# Patient Record
Sex: Male | Born: 1990 | Race: White | Hispanic: Yes | Marital: Married | Smoking: Never smoker
Health system: Southern US, Community
[De-identification: ages and names within clinical notes are randomized; demographics above are authoritative.]

## PROBLEM LIST (undated history)

## (undated) DIAGNOSIS — G43909 Migraine, unspecified, not intractable, without status migrainosus: Secondary | ICD-10-CM

## (undated) HISTORY — DX: Migraine, unspecified, not intractable, without status migrainosus: G43.909

---
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
## 2009-01-05 ENCOUNTER — Ambulatory Visit (HOSPITAL_BASED_OUTPATIENT_CLINIC_OR_DEPARTMENT_OTHER): Admission: RE | Admit: 2009-01-05 | Discharge: 2009-01-05 | Payer: Self-pay | Admitting: Plastic Surgery

## 2009-02-08 ENCOUNTER — Ambulatory Visit (HOSPITAL_COMMUNITY): Admission: RE | Admit: 2009-02-08 | Discharge: 2009-02-08 | Payer: Self-pay | Admitting: Plastic Surgery

## 2009-07-08 ENCOUNTER — Emergency Department (HOSPITAL_COMMUNITY): Admission: EM | Admit: 2009-07-08 | Discharge: 2009-07-09 | Payer: Self-pay | Admitting: Emergency Medicine

## 2009-09-13 ENCOUNTER — Emergency Department (HOSPITAL_COMMUNITY): Admission: EM | Admit: 2009-09-13 | Discharge: 2009-09-13 | Payer: Self-pay | Admitting: Emergency Medicine

## 2009-09-14 ENCOUNTER — Ambulatory Visit: Payer: Self-pay | Admitting: Psychiatry

## 2009-09-14 ENCOUNTER — Inpatient Hospital Stay (HOSPITAL_COMMUNITY): Admission: EM | Admit: 2009-09-14 | Discharge: 2009-09-14 | Payer: Self-pay | Admitting: Psychiatry

## 2009-09-17 IMAGING — CR DG CHEST 2V
2 series · 2 of 2 positions shown · non-contrast
Comparison: 01/03/2007

CLINICAL DATA: Cough short of breath

CHEST - 2 VIEW

[w chest pa]
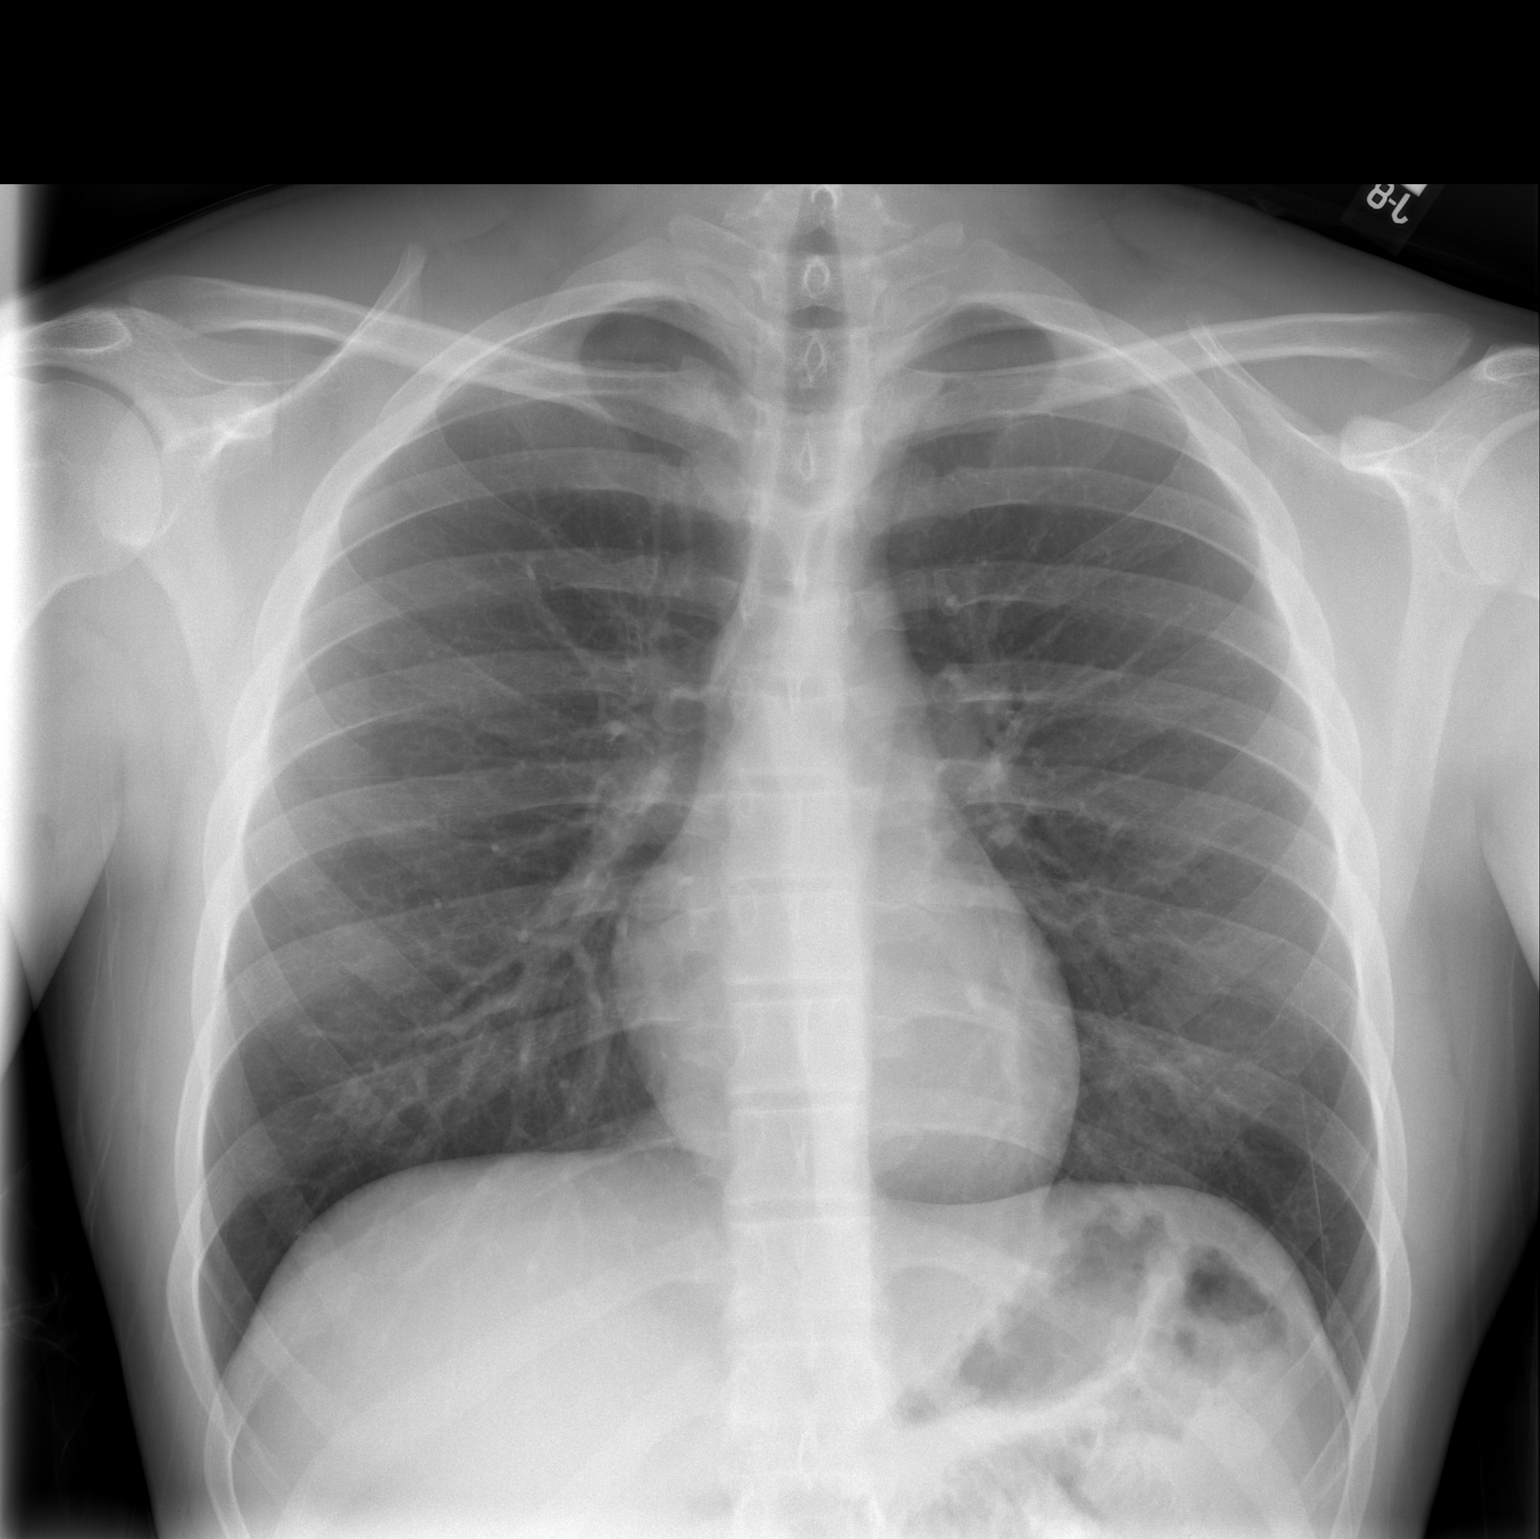

[w chest lat]
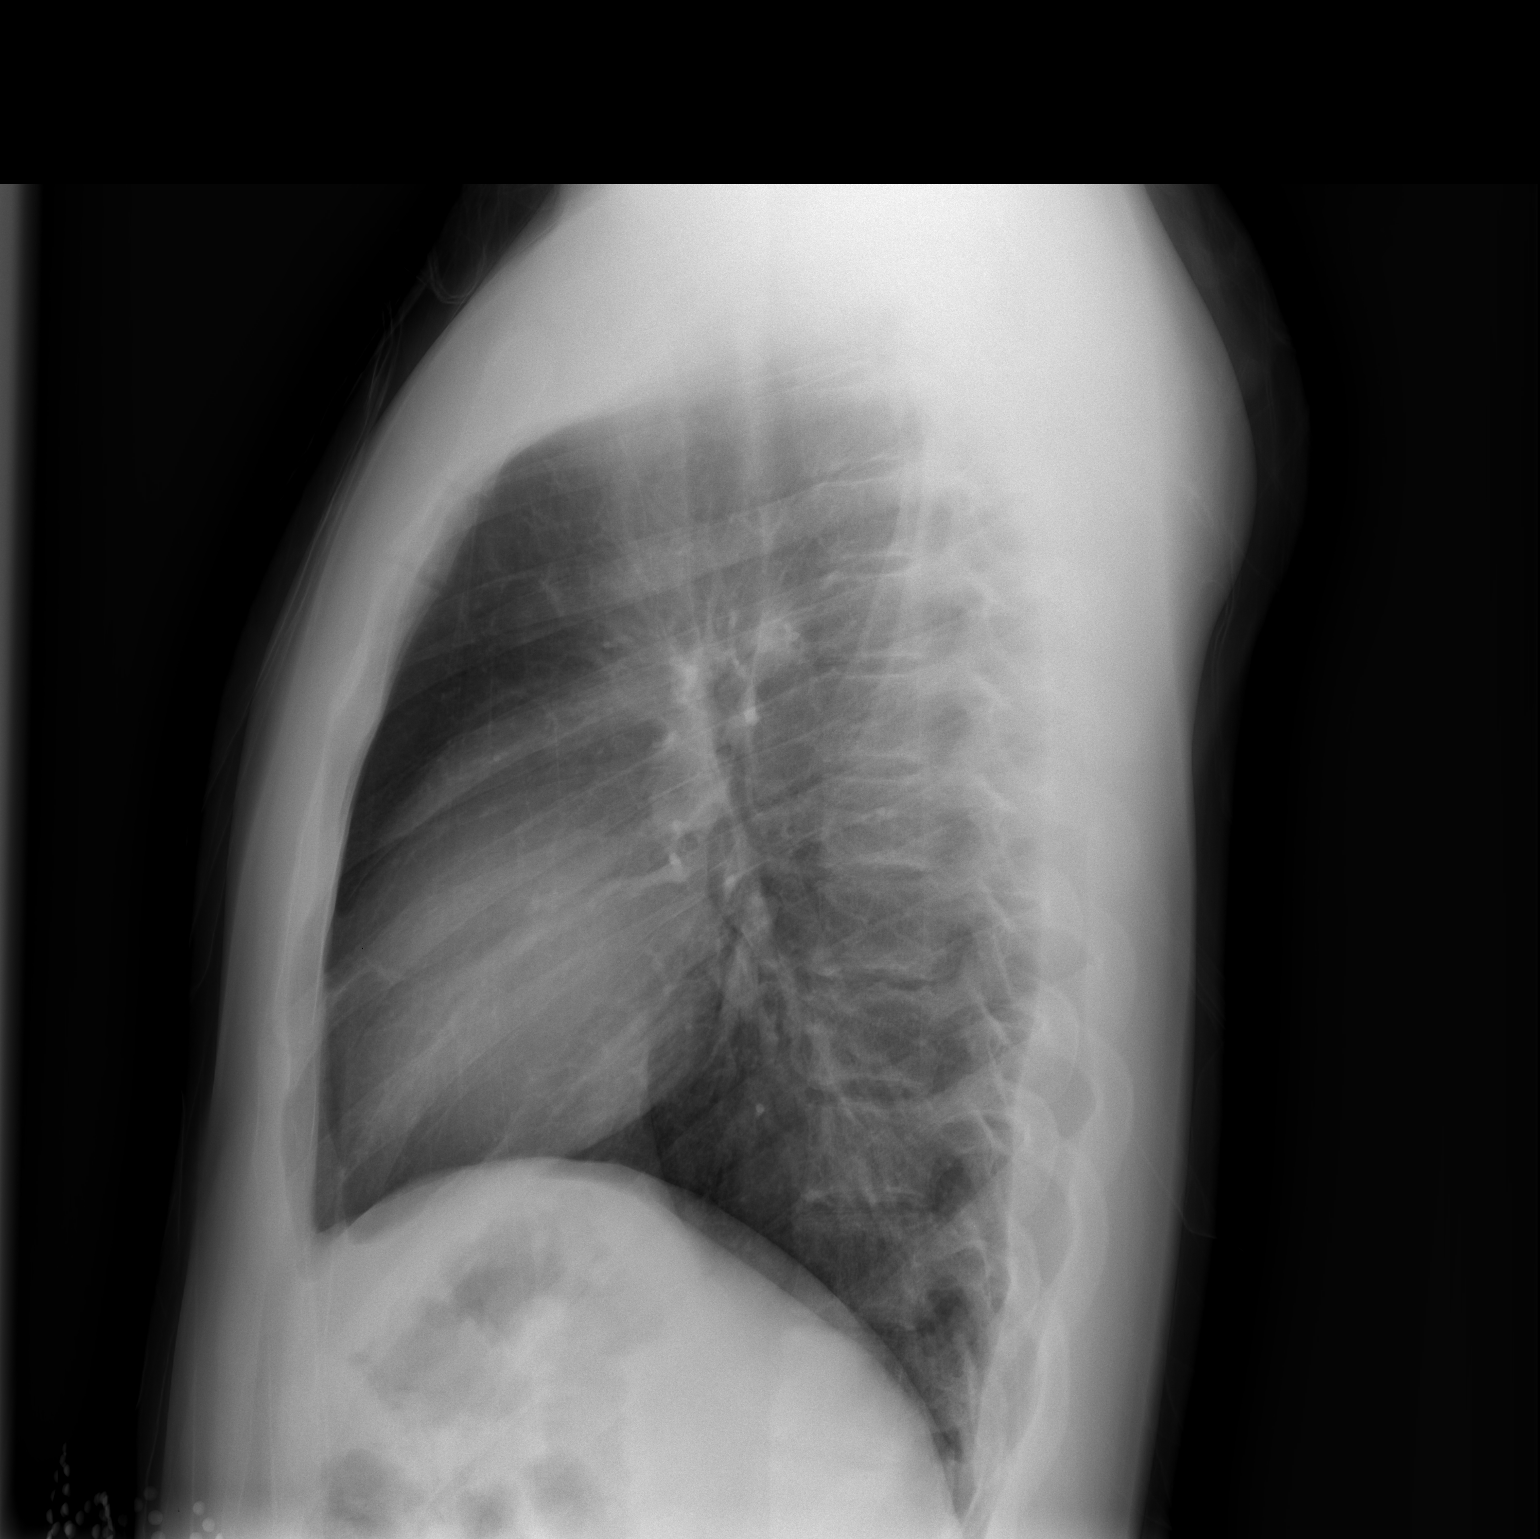

[2 of 2 positions shown; findings below may reference images not displayed]

FINDINGS: The heart size and mediastinal contours are within normal
limits.  Both lungs are clear.  The visualized skeletal structures
are unremarkable.
IMPRESSION: No active cardiopulmonary disease.

## 2010-10-01 IMAGING — CR DG HAND 2V*R*
3 series · 3 of 3 positions shown · non-contrast
Comparison: 12/26/2008

CLINICAL DATA: Assess healing of boxer's fracture.

RIGHT HAND - 2 VIEW

[x hand pa right (1 of 2)]
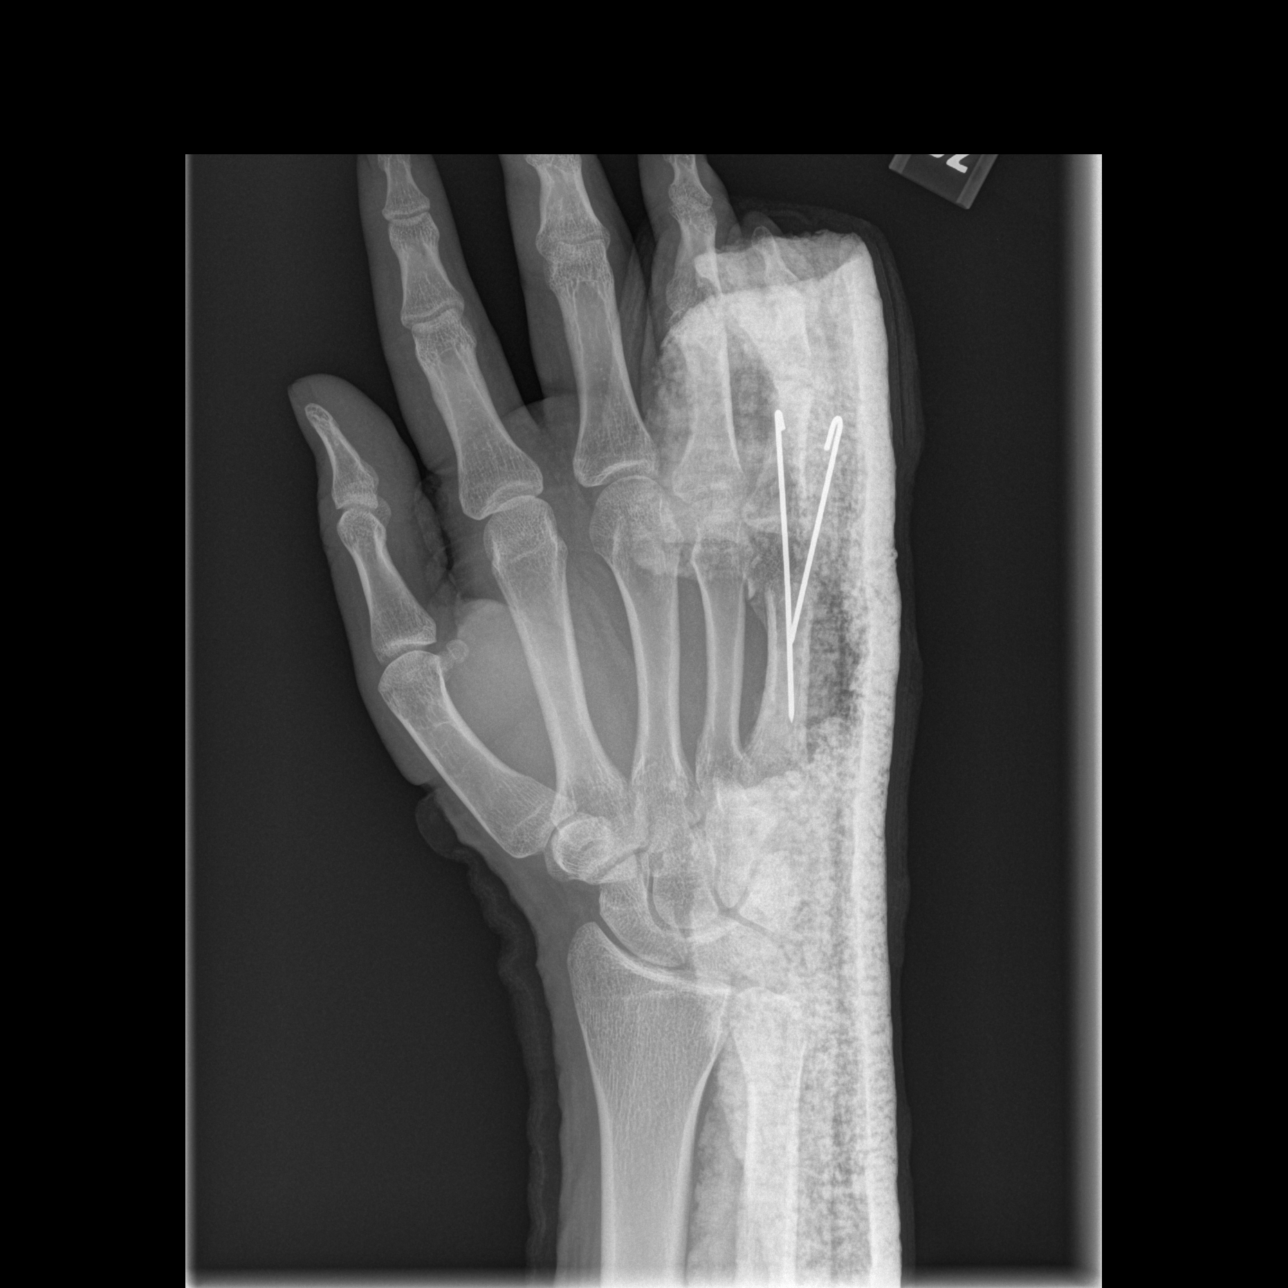

[x hand lat right]
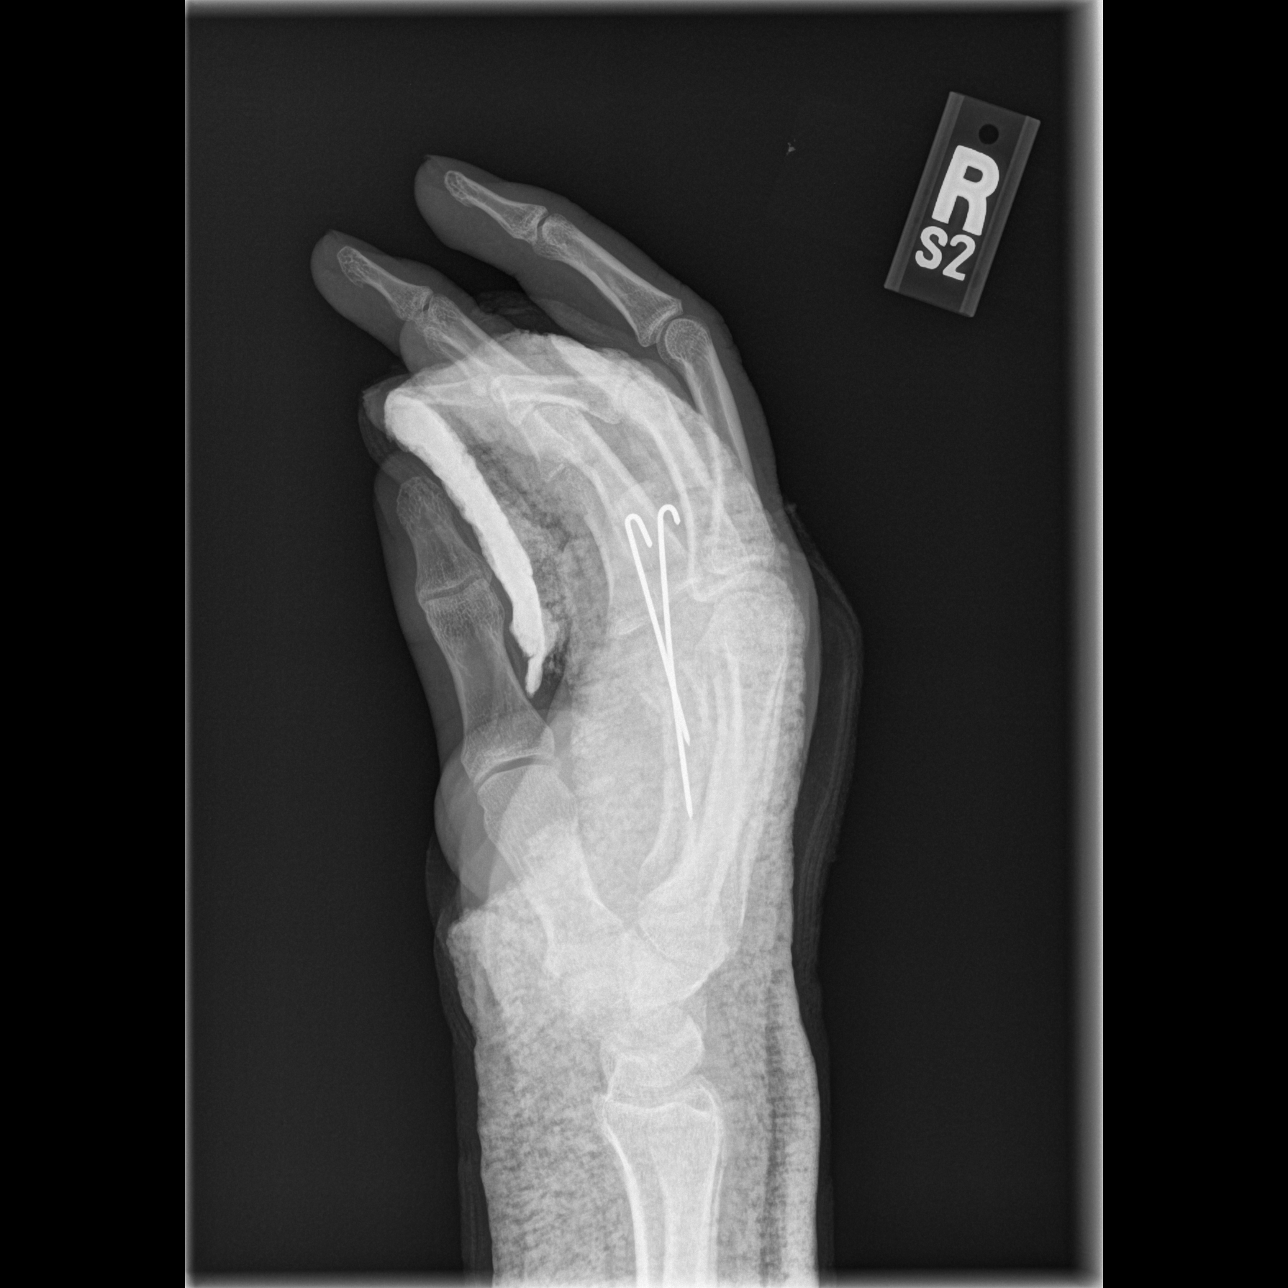

[x hand pa right (2 of 2)]
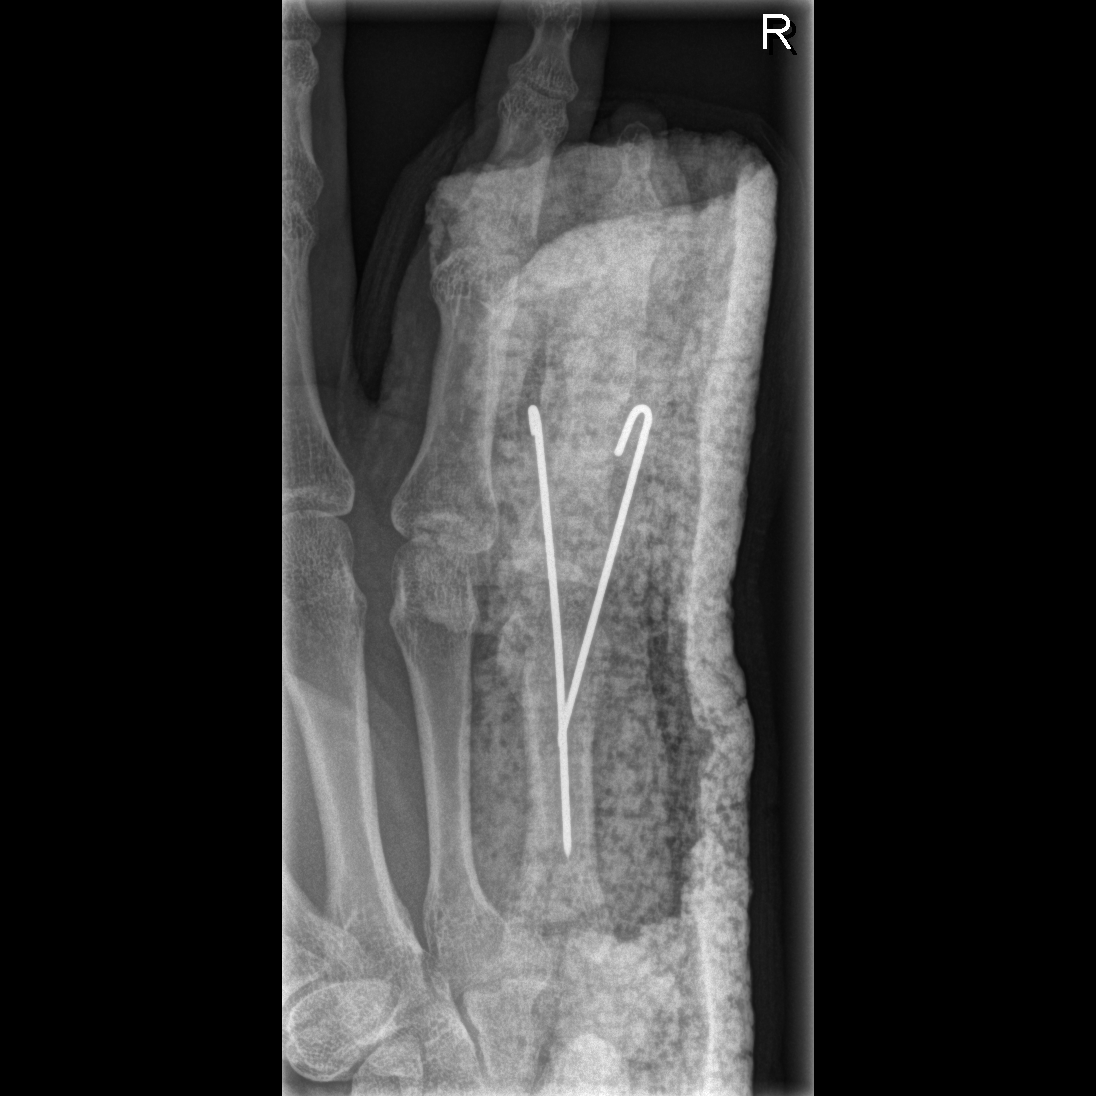

[3 of 3 positions shown; findings below may reference images not displayed]

FINDINGS: There is overlying plaster casting material which
obscures assessment of the fifth ray.

There are two K-wires which reduce the boxer's fracture.

The fracture fragments appear to be in anatomic alignment.

The fracture lines remain distinct.
IMPRESSION: 1.  Suboptimal examination due to overlying plaster casting
material.
2.  Two K-wires from open reduction are identified and the fracture
lines remain distinct.

## 2011-01-02 LAB — URINALYSIS, ROUTINE W REFLEX MICROSCOPIC
Leukocytes, UA: NEGATIVE
Nitrite: NEGATIVE
Protein, ur: NEGATIVE mg/dL
Specific Gravity, Urine: 1.028 (ref 1.005–1.030)
Urobilinogen, UA: 0.2 mg/dL (ref 0.0–1.0)

## 2011-01-02 LAB — RAPID URINE DRUG SCREEN, HOSP PERFORMED
Amphetamines: NOT DETECTED
Barbiturates: NOT DETECTED
Benzodiazepines: NOT DETECTED
Tetrahydrocannabinol: NOT DETECTED

## 2011-01-02 LAB — DIFFERENTIAL
Eosinophils Absolute: 0.1 10*3/uL (ref 0.0–0.7)
Eosinophils Relative: 1 % (ref 0–5)
Lymphocytes Relative: 36 % (ref 12–46)
Lymphs Abs: 2.9 10*3/uL (ref 0.7–4.0)
Monocytes Absolute: 0.7 10*3/uL (ref 0.1–1.0)
Monocytes Relative: 8 % (ref 3–12)

## 2011-01-02 LAB — COMPREHENSIVE METABOLIC PANEL
ALT: 23 U/L (ref 0–53)
AST: 20 U/L (ref 0–37)
Albumin: 4.7 g/dL (ref 3.5–5.2)
CO2: 27 mEq/L (ref 19–32)
Calcium: 9.2 mg/dL (ref 8.4–10.5)
GFR calc Af Amer: >60 mL/min (ref >60)
GFR calc non Af Amer: >60 mL/min (ref >60)
Sodium: 136 mEq/L (ref 135–145)

## 2011-01-02 LAB — CBC
MCHC: 33.8 g/dL (ref 30.0–36.0)
Platelets: 226 10*3/uL (ref 150–400)
RBC: 4.79 MIL/uL (ref 4.22–5.81)
WBC: 8.1 10*3/uL (ref 4.0–10.5)

## 2011-01-02 LAB — URINE MICROSCOPIC-ADD ON

## 2011-01-04 LAB — CBC
HCT: 43.9 % (ref 39.0–52.0)
Hemoglobin: 14.8 g/dL (ref 13.0–17.0)
RDW: 14.4 % (ref 11.5–15.5)
WBC: 8.9 10*3/uL (ref 4.0–10.5)

## 2011-01-04 LAB — DIFFERENTIAL
Basophils Absolute: 0 10*3/uL (ref 0.0–0.1)
Basophils Relative: 0 % (ref 0–1)
Eosinophils Absolute: 0 10*3/uL (ref 0.0–0.7)
Monocytes Absolute: 0.5 10*3/uL (ref 0.1–1.0)
Monocytes Relative: 6 % (ref 3–12)

## 2011-01-04 LAB — COMPREHENSIVE METABOLIC PANEL
ALT: 23 U/L (ref 0–53)
Albumin: 4.1 g/dL (ref 3.5–5.2)
Alkaline Phosphatase: 77 U/L (ref 39–117)
BUN: 10 mg/dL (ref 6–23)
Chloride: 106 mEq/L (ref 96–112)
Glucose, Bld: 98 mg/dL (ref 70–99)
Potassium: 4.2 mEq/L (ref 3.5–5.1)
Sodium: 139 mEq/L (ref 135–145)
Total Bilirubin: 0.8 mg/dL (ref 0.3–1.2)
Total Protein: 6.7 g/dL (ref 6.0–8.3)

## 2011-01-04 LAB — ETHANOL: Alcohol, Ethyl (B): <5 mg/dL (ref 0–10)

## 2011-01-04 LAB — RAPID URINE DRUG SCREEN, HOSP PERFORMED
Cocaine: NOT DETECTED
Tetrahydrocannabinol: POSITIVE — AB
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
## 2011-06-01 ENCOUNTER — Inpatient Hospital Stay (INDEPENDENT_AMBULATORY_CARE_PROVIDER_SITE_OTHER)
Admission: RE | Admit: 2011-06-01 | Discharge: 2011-06-01 | Disposition: A | Discharge status: Home / Self Care | Payer: Self-pay | Source: Ambulatory Visit | Attending: Emergency Medicine | Admitting: Emergency Medicine

## 2011-06-01 DIAGNOSIS — K2970 Gastritis, unspecified, without bleeding: Secondary | ICD-10-CM

## 2018-04-27 ENCOUNTER — Emergency Department
Admission: EM | Admit: 2018-04-27 | Discharge: 2018-04-27 | Disposition: A | Discharge status: Home / Self Care | Payer: Self-pay | Source: Home / Self Care

## 2018-04-27 ENCOUNTER — Encounter: Payer: Self-pay | Admitting: Emergency Medicine

## 2018-04-27 DIAGNOSIS — J019 Acute sinusitis, unspecified: Secondary | ICD-10-CM

## 2018-04-27 DIAGNOSIS — R0982 Postnasal drip: Secondary | ICD-10-CM

## 2018-04-27 MED ORDER — CETIRIZINE HCL 10 MG PO TABS: 10.0000 mg | ORAL_TABLET | Freq: Every day | ORAL | 11 refills | Status: DC

## 2018-04-27 MED ORDER — AMOXICILLIN-POT CLAVULANATE 875-125 MG PO TABS: 1.0000 | ORAL_TABLET | Freq: Two times a day (BID) | ORAL | 0 refills | Status: DC

## 2018-04-27 NOTE — ED Triage Notes (Signed)
Patient presents to [facility] with C/O sore throat and sinus congestion times two weeks. Sinus pain in facial area.

## 2018-04-27 NOTE — Discharge Instructions (Addendum)
Take all medication as prescribed. Follow-up here or with PCP if symptoms worsen or do not improve.

## 2018-04-27 NOTE — ED Provider Notes (Signed)
[facility]    CSN: [id number] Arrival date & time: 04/27/18  1358     History   Chief Complaint Chief Complaint  Patient presents with  . Sore Throat   HPI Philip Kirby is a 27 y.o. male who complains of nasal congestion, sore throat, itching in eyes, facial pressure and pressure behind the eyes x 7-14 days. He has attempted relief with Dayquil and benadryl without improvement if symptoms. He denies a history of allergies or asthma. He has remained afebrile. Reports frequent sinus type infections ranging up to a few times per year. Patient is a non-smoker. Home Medications    Prior to Admission medications   Medication Sig Start Date End Date Taking? Authorizing Provider  amoxicillin-clavulanate (AUGMENTIN) 875-125 MG tablet Take 1 tablet by mouth every 12 (twelve) hours. 04/27/18   Bing Neighbors, FNP  cetirizine (ZYRTEC) 10 MG tablet Take 1 tablet (10 mg total) by mouth daily. 04/27/18   Bing Neighbors, FNP    Family History History reviewed. No pertinent family history.  Social History Social History   Tobacco Use  . Smoking status: Never Smoker  . Smokeless tobacco: Never Used  Substance Use Topics  . Alcohol use: Yes  . Drug use: Never     Allergies   Patient has no allergy information on record.   Review of Systems Review of Systems Pertinent negatives listed in HPI  Physical Exam Triage Vital Signs ED Triage Vitals  Enc Vitals Group     BP 04/27/18 1421 (!) 136/91     Pulse Rate 04/27/18 1421 64     Resp 04/27/18 1421 16     Temp 04/27/18 1421 98 F (36.7 C)     Temp Source 04/27/18 1421 Oral     SpO2 04/27/18 1421 97 %     Weight 04/27/18 1422 203 lb (92.1 kg)     Height 04/27/18 1422 6' (1.829 m)     Head Circumference --      Peak Flow --      Pain Score 04/27/18 1421 4     Pain Loc --      Pain Edu? --      Excl. in GC? --    No data found.  Updated Vital Signs BP (!) 136/91 (BP Location: Right Arm)   Pulse 64    Temp 98 F (36.7 C) (Oral)   Resp 16   Ht 6' (1.829 m)   Wt 203 lb (92.1 kg)   SpO2 97%   BMI 27.53 kg/m   Visual Acuity Right Eye Distance:   Left Eye Distance:   Bilateral Distance:    Right Eye Near:   Left Eye Near:    Bilateral Near:     Physical Exam  Constitutional: He appears well-developed and well-nourished.  HENT:  Head: Normocephalic and atraumatic.  Right Ear: Hearing, tympanic membrane, external ear and ear canal normal.  Left Ear: Hearing, tympanic membrane, external ear and ear canal normal.  Nose: Mucosal edema and rhinorrhea present. Right sinus exhibits maxillary sinus tenderness. Left sinus exhibits maxillary sinus tenderness.  Mouth/Throat: Mucous membranes are normal. Posterior oropharyngeal erythema present. No oropharyngeal exudate or posterior oropharyngeal edema. Tonsils are 0 on the right. Tonsils are 0 on the left. No tonsillar exudate.  Eyes: Pupils are equal, round, and reactive to light. EOM are normal.  Neck: Normal range of motion. Neck supple.  Cardiovascular: Normal rate.  Pulmonary/Chest: Effort normal and breath sounds normal.  Lymphadenopathy:  He has no cervical adenopathy.  Skin: Skin is warm and dry.   UC Treatments / Results  Labs (all labs ordered are listed, but only abnormal results are displayed) Labs Reviewed - No data to display  EKG None  Radiology No results found.  Procedures Procedures (including critical care time)  Medications Ordered in UC Medications - No data to display  Initial Impression / Assessment and Plan / UC Course  I have reviewed the triage vital signs and the nursing notes.  Pertinent labs & imaging results that were available during my care of the patient were reviewed by me and considered in my medical decision making (see chart for details).  Patient presents today with URI symptoms consistent with sinusitis. Soreness of throat most likely related to post nasal drainage. As symptoms have  remained unchanged with OTC treatment, will treat with a course of Augmentin twice daily x 10 days. Recommended adding antihistamine therapy with cetrizine 10 mg once daily at bedtime. Patient advised to follow-up here or with PCP if symptoms worsen or do not improve. Patient verbalized understanding.  Final Clinical Impressions(s) / UC Diagnoses   Final diagnoses:  Acute non-recurrent sinusitis, unspecified location  Post-nasal drainage    ED Prescriptions    Medication Sig Dispense Auth. Provider   amoxicillin-clavulanate (AUGMENTIN) 875-125 MG tablet Take 1 tablet by mouth every 12 (twelve) hours. 14 tablet Bing NeighborsHarris, Verneal Wiers S, FNP   cetirizine (ZYRTEC) 10 MG tablet Take 1 tablet (10 mg total) by mouth daily. 30 tablet Bing NeighborsHarris, Addyson Traub S, FNP     Controlled Substance Prescriptions [place] Controlled Substance Registry consulted? Not Applicable   Bing NeighborsHarris, Rahma Meller S, FNP 04/27/18 [phone]

## 2024-10-29 ENCOUNTER — Ambulatory Visit: Payer: Self-pay | Admitting: Physician Assistant

## 2024-10-29 ENCOUNTER — Encounter: Payer: Self-pay | Admitting: Physician Assistant

## 2024-10-29 VITALS — BP 138/96 | HR 71 | Temp 98.6°F | Resp 16 | Ht 73.0 in | Wt 228.0 lb

## 2024-10-29 DIAGNOSIS — G43101 Migraine with aura, not intractable, with status migrainosus: Secondary | ICD-10-CM | POA: Diagnosis not present

## 2024-10-29 DIAGNOSIS — G43109 Migraine with aura, not intractable, without status migrainosus: Secondary | ICD-10-CM | POA: Insufficient documentation

## 2024-10-29 DIAGNOSIS — R3121 Asymptomatic microscopic hematuria: Secondary | ICD-10-CM | POA: Diagnosis not present

## 2024-10-29 DIAGNOSIS — I10 Essential (primary) hypertension: Secondary | ICD-10-CM | POA: Insufficient documentation

## 2024-10-29 MED ORDER — SUMATRIPTAN SUCCINATE 25 MG PO TABS: 25.0000 mg | ORAL_TABLET | ORAL | 5 refills | Status: AC | PRN

## 2024-10-29 MED ORDER — SUMATRIPTAN SUCCINATE 25 MG PO TABS: 25.0000 mg | ORAL_TABLET | ORAL | 5 refills | Status: DC | PRN

## 2024-10-29 NOTE — Patient Instructions (Signed)
" °  VISIT SUMMARY: During your visit, we discussed your increasing frequency of migraines, the detection of microscopic hematuria, and recent elevated blood pressure readings. We also reviewed your general health maintenance needs.  YOUR PLAN: -MIGRAINE WITH AURA: Migraine with aura is a type of headache that includes visual disturbances before the headache starts. You will continue taking sumatriptan  for abortive treatment, naproxen as needed, and Zofran for nausea. We discussed lifestyle modifications to identify and avoid triggers, and we may consider preventative medications if your migraines worsen.  -ASYMPTOMATIC MICROSCOPIC HEMATURIA: Microscopic hematuria means there are small amounts of blood in your urine that are not visible to the naked eye. Since your recent urinalysis was normal and you have no symptoms, we will reassess if any symptoms develop or if further evaluation is needed.  -ELEVATED BLOOD PRESSURE: Elevated blood pressure means your blood pressure readings are higher than normal. We discussed dietary modifications to reduce salt and avoid fast food. We will reassess your blood pressure at your next visit and consider medication if it remains elevated.  -GENERAL HEALTH MAINTENANCE: We discussed the importance of routine health maintenance, including scheduling an annual physical exam with fasting labs such as CBC, CMP, lipid panel, and A1c. We will also reassess your blood pressure at your next visit.  INSTRUCTIONS: Please schedule an annual physical exam with fasting labs including CBC, CMP, lipid panel, and A1c. We will reassess your blood pressure at your next visit.    Contains text generated by [employer].   "

## 2024-10-29 NOTE — Assessment & Plan Note (Signed)
-   Elevated readings in clinic, no prior hypertension history.  - Discussed lifestyle and dietary modifications. - Reassess blood pressure at next visit. - Consider antihypertensive medication and home BP cuff if blood pressure remains elevated.

## 2024-10-29 NOTE — Assessment & Plan Note (Addendum)
-   Chronic migraines with aura and nausea. - Recently increased frequency, upwards of 1-2/month. - Varied duration, 2-6 hours - Typically resolves with a nap, but the prescribed sumatriptan -naproxen has helped. - -Encouraged lifestyle modifications to identify and avoid triggers. - Will prescribe sumatriptan  25 mg as it is on formulary.  - Continue with Zofran as needed. - Discussed potential preventative medications if migraines become more frequent and problematic.

## 2024-10-29 NOTE — Progress Notes (Signed)
 "  New Patient Office Visit  Subjective    Patient ID: Philip Kirby, male    DOB: [birth year]  Age: 34 y.o. MRN: [id number]  CC:  Chief Complaint  Patient presents with   New Patient (Initial Visit)   History of Present Illness Philip Kirby is a 34 year old male with migraine disorder who presents to establish care.  He has a long-standing history of migraine disorder, experiencing migraines since a young age. Recently, the frequency has increased to once or twice a month. Migraines are preceded by visual disturbances such as 'squiggly lines' lasting 30 minutes to an hour, followed by severe pain that necessitates lying down and sleeping. The pain can persist for hours if he remains awake, but improves after a nap. He experiences associated nausea and gagging but no vomiting. He takes sumatriptan  and naproxen with some relief and uses Zofran for nausea. He is attempting to identify triggers by keeping a headache diary and has made lifestyle changes such as reducing alcohol and tobacco use.  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 He was asymptomatic at the time and has not had any further workup. No visible blood in urine, burning with urination, penile discharge, or back pain.  He recently visited urgent care for constipation and abnormal stool appearance. A stool sample was taken, and results were normal with no ova or parasites detected. He denies bloody stools or any ongoing abdominal symptoms.   His family history is notable for migraines in his mother, although he has limited contact with her. He was raised by his father and has two older brothers and half-siblings, all reportedly healthy.  Socially, he is married with two children, aged 40 and 49. 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 He has a history of smoking occasionally but has quit. His diet is predominantly comprise of Mexican food and he is working on  reducing salt as well as fast food consumption.     Outpatient Encounter Medications as of 10/29/2024  Medication Sig   ondansetron (ZOFRAN-ODT) 4 MG disintegrating tablet Take 4 mg by mouth every 8 (eight) hours as needed.   [DISCONTINUED] SUMAtriptan  (IMITREX ) 25 MG tablet Take 1 tablet (25 mg total) by mouth every 2 (two) hours as needed for migraine. May repeat in 2 hours if headache persists or recurs.   [DISCONTINUED] SUMAtriptan -naproxen (TREXIMET) 85-500 MG tablet Take by mouth.   SUMAtriptan  (IMITREX ) 25 MG tablet Take 1 tablet (25 mg total) by mouth every 2 (two) hours as needed for migraine. May repeat in 2 hours if headache persists or recurs.   [DISCONTINUED] amoxicillin -clavulanate (AUGMENTIN ) 875-125 MG tablet Take 1 tablet by mouth every 12 (twelve) hours.   [DISCONTINUED] cetirizine  (ZYRTEC ) 10 MG tablet Take 1 tablet (10 mg total) by mouth daily.   No facility-administered encounter medications on file as of 10/29/2024.    Past Medical History:  Diagnosis Date   Migraines     Past Surgical History:  Procedure Laterality Date   HAND SURGERY Right    age 35, boxer's fracture. pin placement.   WISDOM TOOTH EXTRACTION      Family History  Problem Relation Age of Onset   Migraines Mother    Hypertension Father    Obesity Father    Lung cancer Paternal Grandmother     Social History   Socioeconomic History   Marital status: Married    Spouse name: Not on file  Number of children: Not on file   Years of education: Not on file   Highest education level: GED or equivalent  Occupational History   Not on file  Tobacco Use   Smoking status: Never   Smokeless tobacco: Never  Vaping Use   Vaping status: Never Used  Substance and Sexual Activity   Alcohol use: Yes    Alcohol/week: 5.0 standard drinks of alcohol    Types: 5 Shots of liquor per week    Comment: every other week   Drug use: Never   Sexual activity: Yes    Partners: Female    Birth  control/protection: None  Other Topics Concern   Not on file  Social History Narrative   Not on file   Social Drivers of Health   Tobacco Use: Low Risk (10/29/2024)   Patient History    Smoking Tobacco Use: Never    Smokeless Tobacco Use: Never    Passive Exposure: Not on file  Financial Resource Strain: Low Risk (10/28/2024)   Overall Financial Resource Strain (CARDIA)    Difficulty of Paying Living Expenses: Not very hard  Food Insecurity: No Food Insecurity (10/28/2024)   Epic    Worried About Radiation Protection Practitioner of Food in the Last Year: Never true    Ran Out of Food in the Last Year: Never true  Transportation Needs: No Transportation Needs (10/28/2024)   Epic    Lack of Transportation (Medical): No    Lack of Transportation (Non-Medical): No  Physical Activity: Sufficiently Active (10/28/2024)   Exercise Vital Sign    Days of Exercise per Week: 5 days    Minutes of Exercise per Session: 60 min  Stress: Stress Concern Present (10/28/2024)   [employer] of Occupational Health - Occupational Stress Questionnaire    Feeling of Stress: To some extent  Social Connections: Moderately Integrated (10/28/2024)   Social Connection and Isolation Panel    Frequency of Communication with Friends and Family: Three times a week    Frequency of Social Gatherings with Friends and Family: Once a week    Attends Religious Services: More than 4 times per year    Active Member of [employer] or Organizations: No    Attends Engineer, Structural: Not on file    Marital Status: Married  Catering Manager Violence: Not on file  Depression (PHQ2-9): Low Risk (10/29/2024)   Depression (PHQ2-9)    PHQ-2 Score: 0  Alcohol Screen: Medium Risk (10/28/2024)   Alcohol Screen    Last Alcohol Screening Score (AUDIT): 9  Housing: Low Risk (10/28/2024)   Epic    Unable to Pay for Housing in the Last Year: No    Number of Times Moved in the Last Year: 0    Homeless in the Last Year: No  Utilities: Not At  Risk (07/17/2024)   Received from [facility]   Epic    In the past 12 months has the electric, gas, oil, or water company threatened to shut off services in your home?: No  Health Literacy: Not on file    ROS See HPI, all else negative.      Objective    BP (!) 138/96 (BP Location: Left Arm, Patient Position: Sitting, Cuff Size: Large)   Pulse 71   Temp 98.6 F (37 C) (Oral)   Resp 16   Ht 6' 1 (1.854 m)   Wt 228 lb (103.4 kg)   SpO2 99%   BMI 30.08 kg/m   Physical Exam  Constitutional:      Appearance: Normal appearance.  Eyes:     Extraocular Movements: Extraocular movements intact.  Cardiovascular:     Rate and Rhythm: Normal rate and regular rhythm.     Pulses: Normal pulses.     Heart sounds: Normal heart sounds.  Pulmonary:     Effort: Pulmonary effort is normal.     Breath sounds: Normal breath sounds.  Musculoskeletal:        General: Normal range of motion.     Cervical back: Normal range of motion.  Neurological:     Mental Status: He is alert and oriented to person, place, and time.  Psychiatric:        Behavior: Behavior normal.         Assessment & Plan:   Problem List Items Addressed This Visit       Cardiovascular and Mediastinum   Migraine headache with aura - Primary   - Chronic migraines with aura and nausea. - Recently increased frequency, upwards of 1-2/month. - Varied duration, 2-6 hours - Typically resolves with a nap, but the prescribed sumatriptan -naproxen has helped. - -Encouraged lifestyle modifications to identify and avoid triggers. - Will prescribe sumatriptan  25 mg as it is on formulary.  - Continue with Zofran as needed. - Discussed potential preventative medications if migraines become more frequent and problematic.      Relevant Medications   SUMAtriptan  (IMITREX ) 25 MG tablet   Essential hypertension   - Elevated readings in clinic, no prior hypertension history.  - Discussed lifestyle and  dietary modifications. - Reassess blood pressure at next visit. - Consider antihypertensive medication and home BP cuff if blood pressure remains elevated.        Genitourinary   Asymptomatic microscopic hematuria   - Noted during DOT physical, no associated symptoms. Recent urinalysis normal. - Reassess if symptoms develop or further evaluation needed.         Return in about 3 weeks (around 11/19/2024) for Annual Physical Appt.   Honora Seip, PA-C   "

## 2024-10-29 NOTE — Assessment & Plan Note (Addendum)
-   Noted during DOT physical, no associated symptoms. Recent urinalysis normal. - Reassess if symptoms develop or further evaluation needed.

## 2024-11-16 ENCOUNTER — Encounter: Admitting: Physician Assistant
